# Patient Record
Sex: Female | Born: 1935 | Race: White | Hispanic: No
Health system: Southern US, Community
[De-identification: ages and names within clinical notes are randomized; demographics above are authoritative.]

---

## 2014-06-01 ENCOUNTER — Emergency Department: Payer: Self-pay | Admitting: Emergency Medicine

## 2014-06-01 LAB — COMPREHENSIVE METABOLIC PANEL
ALBUMIN: 4.4 g/dL (ref 3.4–5.0)
Alkaline Phosphatase: 90 U/L
Anion Gap: 5 — ABNORMAL LOW (ref 7–16)
BILIRUBIN TOTAL: 0.3 mg/dL (ref 0.2–1.0)
BUN: 24 mg/dL — AB (ref 7–18)
CO2: 28 mmol/L (ref 21–32)
CREATININE: 1.05 mg/dL (ref 0.60–1.30)
Calcium, Total: 11.4 mg/dL — ABNORMAL HIGH (ref 8.5–10.1)
Chloride: 106 mmol/L (ref 98–107)
EGFR (Non-African Amer.): 48 — ABNORMAL LOW
GFR CALC AF AMER: 55 — AB
Glucose: 144 mg/dL — ABNORMAL HIGH (ref 65–99)
Osmolality: 284 (ref 275–301)
POTASSIUM: 4.9 mmol/L (ref 3.5–5.1)
SGOT(AST): 34 U/L (ref 15–37)
SGPT (ALT): 22 U/L (ref 12–78)
Sodium: 139 mmol/L (ref 136–145)
Total Protein: 8.8 g/dL — ABNORMAL HIGH (ref 6.4–8.2)

## 2014-06-01 LAB — CBC
HCT: 35.6 % (ref 35.0–47.0)
HGB: 11.6 g/dL — AB (ref 12.0–16.0)
MCH: 31.4 pg (ref 26.0–34.0)
MCHC: 32.5 g/dL (ref 32.0–36.0)
MCV: 96 fL (ref 80–100)
PLATELETS: 285 10*3/uL (ref 150–440)
RBC: 3.69 10*6/uL — ABNORMAL LOW (ref 3.80–5.20)
RDW: 13.4 % (ref 11.5–14.5)
WBC: 6.7 10*3/uL (ref 3.6–11.0)

## 2014-06-01 LAB — PROTIME-INR
INR: 0.9
PROTHROMBIN TIME: 12.5 s (ref 11.5–14.7)

## 2014-06-01 LAB — URINALYSIS, COMPLETE
BLOOD: NEGATIVE
Bacteria: NONE SEEN
Bilirubin,UR: NEGATIVE
GLUCOSE, UR: NEGATIVE mg/dL (ref 0–75)
Ketone: NEGATIVE
Leukocyte Esterase: NEGATIVE
Nitrite: NEGATIVE
Ph: 7 (ref 4.5–8.0)
Specific Gravity: 1.01 (ref 1.003–1.030)

## 2014-06-01 LAB — TROPONIN I: Troponin-I: <0.02 ng/mL

## 2014-06-02 ENCOUNTER — Emergency Department: Payer: Self-pay | Admitting: Emergency Medicine

## 2014-06-18 ENCOUNTER — Emergency Department: Payer: Self-pay | Admitting: Emergency Medicine

## 2014-06-29 ENCOUNTER — Emergency Department: Payer: Self-pay | Admitting: Emergency Medicine

## 2014-06-29 LAB — BASIC METABOLIC PANEL
Anion Gap: 4 — ABNORMAL LOW (ref 7–16)
BUN: 20 mg/dL — AB (ref 7–18)
CHLORIDE: 107 mmol/L (ref 98–107)
CO2: 30 mmol/L (ref 21–32)
CREATININE: 1.21 mg/dL (ref 0.60–1.30)
Calcium, Total: 9.6 mg/dL (ref 8.5–10.1)
EGFR (Non-African Amer.): 40 — ABNORMAL LOW
GFR CALC AF AMER: 47 — AB
GLUCOSE: 281 mg/dL — AB (ref 65–99)
OSMOLALITY: 294 (ref 275–301)
POTASSIUM: 4.4 mmol/L (ref 3.5–5.1)
SODIUM: 141 mmol/L (ref 136–145)

## 2014-06-29 LAB — URINALYSIS, COMPLETE
BACTERIA: NONE SEEN
Bilirubin,UR: NEGATIVE
Blood: NEGATIVE
Glucose,UR: 50 mg/dL (ref 0–75)
KETONE: NEGATIVE
Leukocyte Esterase: NEGATIVE
NITRITE: NEGATIVE
PH: 7 (ref 4.5–8.0)
Protein: 30
SQUAMOUS EPITHELIAL: NONE SEEN
Specific Gravity: 1.009 (ref 1.003–1.030)
WBC UR: NONE SEEN /HPF (ref 0–5)

## 2014-06-29 LAB — CBC
HCT: 27.7 % — AB (ref 35.0–47.0)
HGB: 8.8 g/dL — ABNORMAL LOW (ref 12.0–16.0)
MCH: 30 pg (ref 26.0–34.0)
MCHC: 31.7 g/dL — AB (ref 32.0–36.0)
MCV: 95 fL (ref 80–100)
Platelet: 213 10*3/uL (ref 150–440)
RBC: 2.92 10*6/uL — AB (ref 3.80–5.20)
RDW: 12.7 % (ref 11.5–14.5)
WBC: 8 10*3/uL (ref 3.6–11.0)

## 2014-07-12 ENCOUNTER — Emergency Department: Payer: Self-pay | Admitting: Emergency Medicine

## 2014-07-12 LAB — URINALYSIS, COMPLETE
Bacteria: NONE SEEN
Bilirubin,UR: NEGATIVE
Blood: NEGATIVE
GLUCOSE, UR: NEGATIVE mg/dL (ref 0–75)
Ketone: NEGATIVE
Leukocyte Esterase: NEGATIVE
NITRITE: NEGATIVE
Ph: 7 (ref 4.5–8.0)
RBC,UR: NONE SEEN /HPF (ref 0–5)
SPECIFIC GRAVITY: 1.011 (ref 1.003–1.030)
Squamous Epithelial: 12
WBC UR: 3 /HPF (ref 0–5)

## 2014-07-12 LAB — COMPREHENSIVE METABOLIC PANEL
ALK PHOS: 76 U/L
AST: 26 U/L (ref 15–37)
Albumin: 3.1 g/dL — ABNORMAL LOW (ref 3.4–5.0)
Anion Gap: 5 — ABNORMAL LOW (ref 7–16)
BUN: 27 mg/dL — ABNORMAL HIGH (ref 7–18)
Bilirubin,Total: 0.2 mg/dL (ref 0.2–1.0)
CHLORIDE: 107 mmol/L (ref 98–107)
Calcium, Total: 10.3 mg/dL — ABNORMAL HIGH (ref 8.5–10.1)
Co2: 29 mmol/L (ref 21–32)
Creatinine: 1.11 mg/dL (ref 0.60–1.30)
EGFR (Non-African Amer.): 45 — ABNORMAL LOW
GFR CALC AF AMER: 52 — AB
Glucose: 124 mg/dL — ABNORMAL HIGH (ref 65–99)
OSMOLALITY: 288 (ref 275–301)
POTASSIUM: 4.4 mmol/L (ref 3.5–5.1)
SGPT (ALT): 15 U/L
Sodium: 141 mmol/L (ref 136–145)
Total Protein: 6.5 g/dL (ref 6.4–8.2)

## 2014-07-12 LAB — CBC
HCT: 28.3 % — ABNORMAL LOW (ref 35.0–47.0)
HGB: 9.3 g/dL — AB (ref 12.0–16.0)
MCH: 30.5 pg (ref 26.0–34.0)
MCHC: 32.8 g/dL (ref 32.0–36.0)
MCV: 93 fL (ref 80–100)
PLATELETS: 230 10*3/uL (ref 150–440)
RBC: 3.04 10*6/uL — ABNORMAL LOW (ref 3.80–5.20)
RDW: 12.8 % (ref 11.5–14.5)
WBC: 8.2 10*3/uL (ref 3.6–11.0)

## 2015-07-13 IMAGING — CT CT HEAD WITHOUT CONTRAST
2 of 5 series · 13 of 47 positions shown, 16 images · non-contrast
Comparison: 06/01/2014

CLINICAL DATA: Fall with head injury

EXAM:
CT HEAD WITHOUT CONTRAST
CT CERVICAL SPINE WITHOUT CONTRAST
TECHNIQUE: Multidetector CT imaging of the head and cervical spine was
performed following the standard protocol without intravenous
contrast. Multiplanar CT image reconstructions of the cervical spine
were also generated.

[Series 5: soft tissue · axial · 0.33mm/px · z∈[+253,+397]mm · 10 of 88 slices shown, 13 images]
[im 8/88  brain]
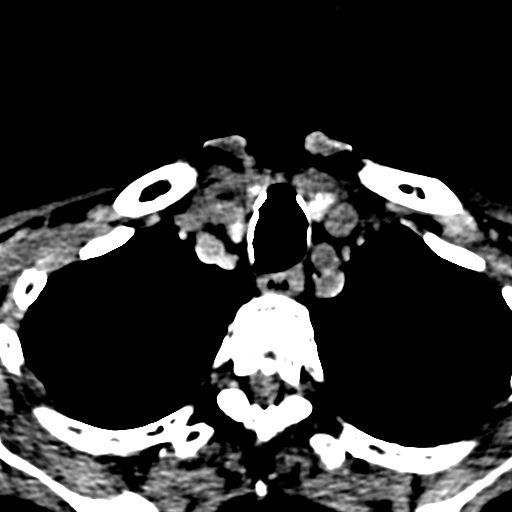
[im 8/88  bone]
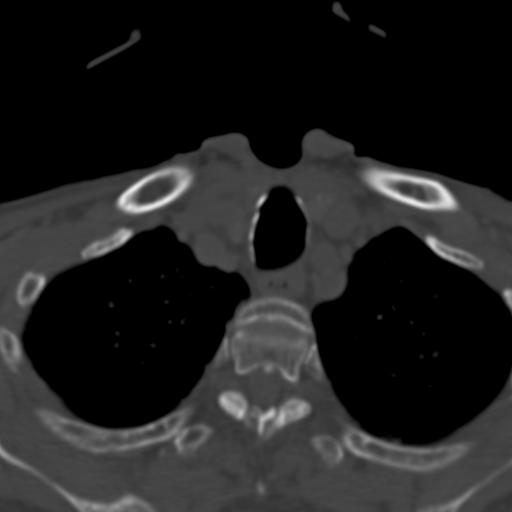
[im 16/88  brain]
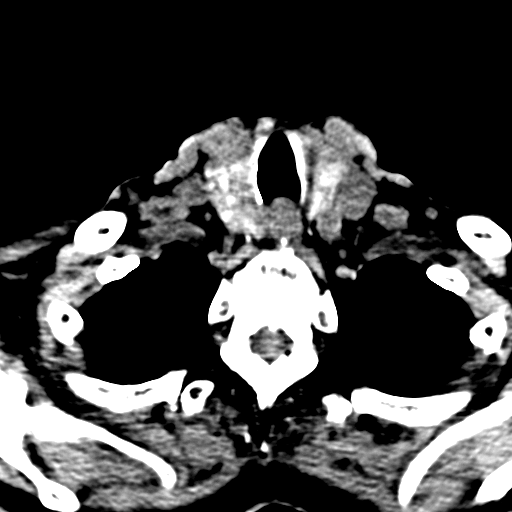
[im 24/88  brain]
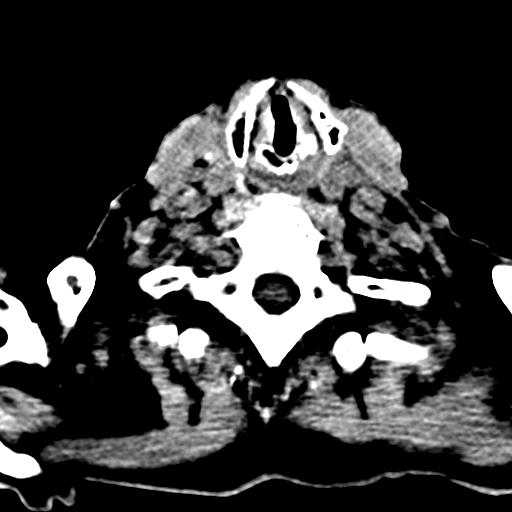
[im 32/88  brain]
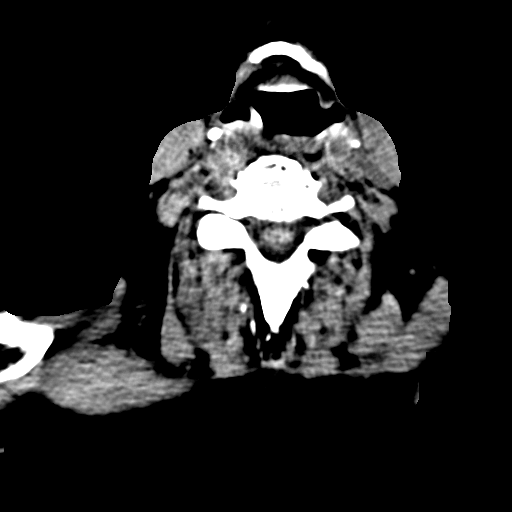
[im 40/88  brain]
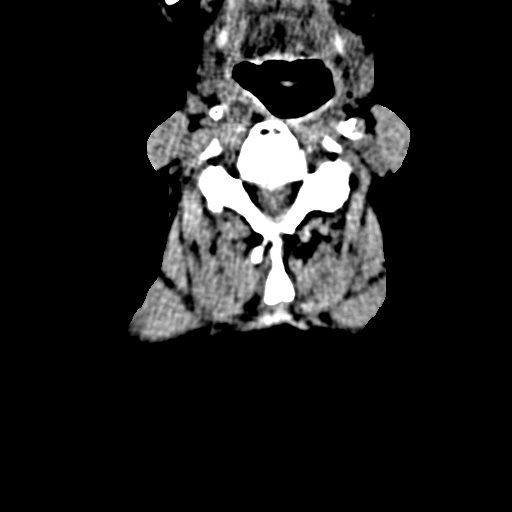
[im 40/88  bone]
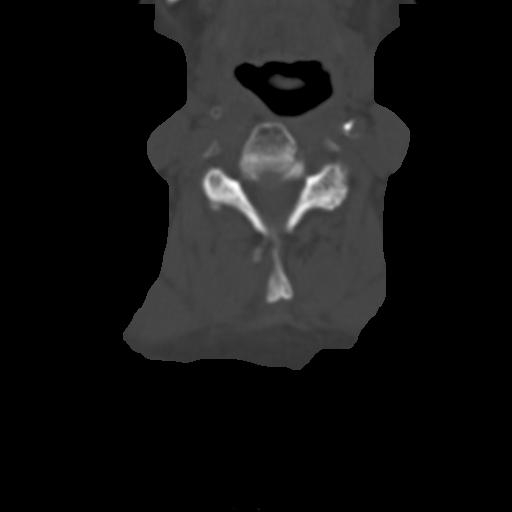
[im 48/88  brain]
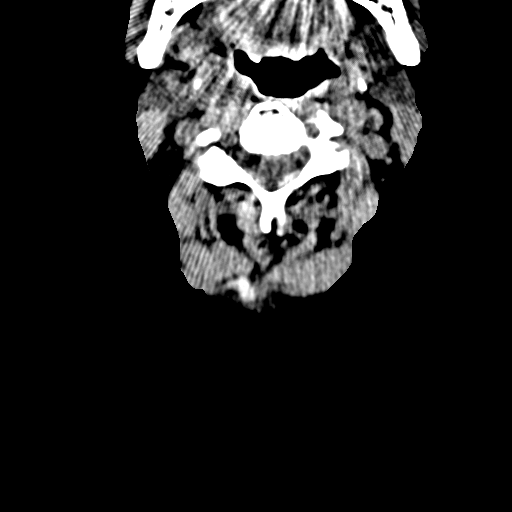
[im 56/88  brain]
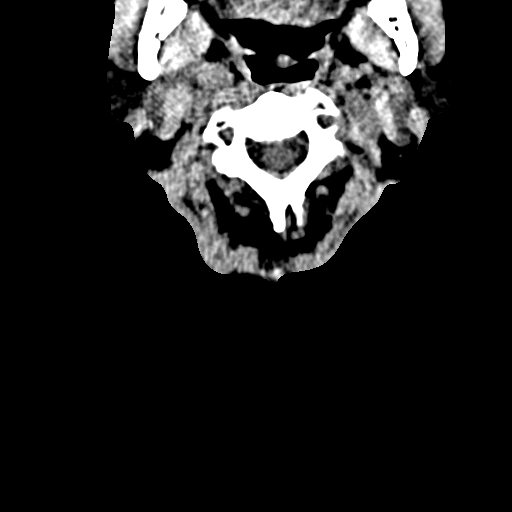
[im 64/88  brain]
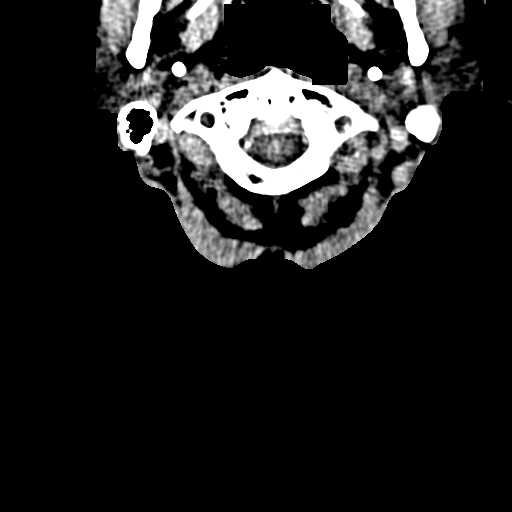
[im 72/88  brain]
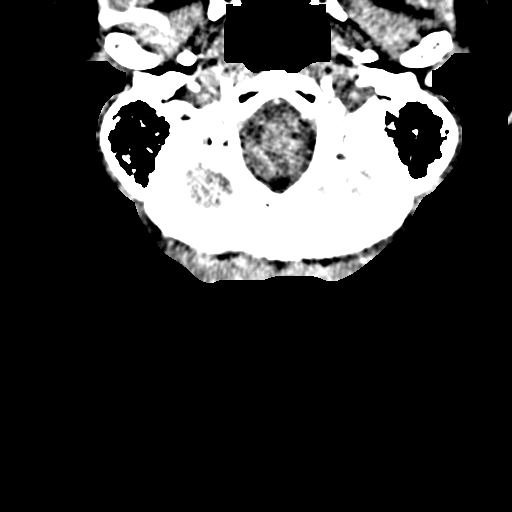
[im 72/88  bone]
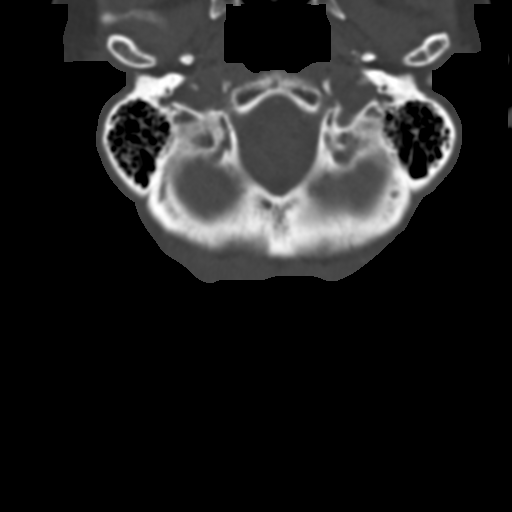
[im 80/88  brain]
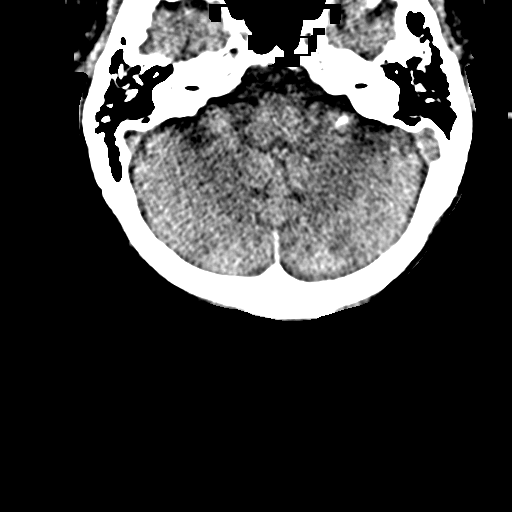

[Series 9: coronal bone · coronal · 0.18mm/px · 3 of 42 slices shown]
[im 14/42  brain]
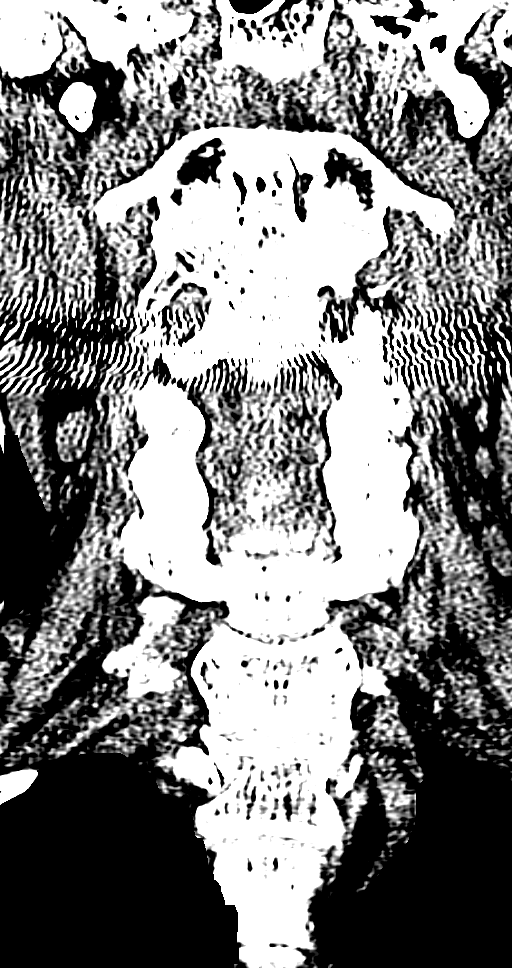
[im 19/42  brain]
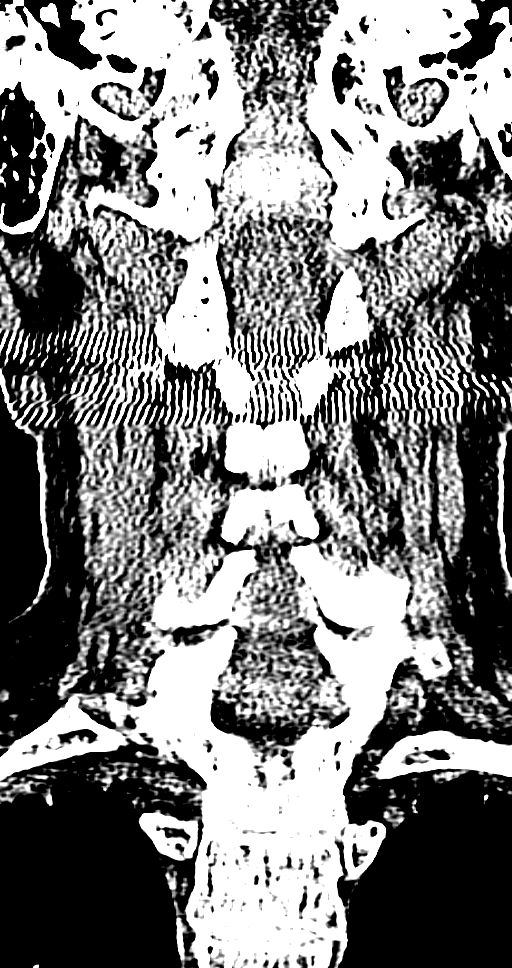
[im 23/42  brain]
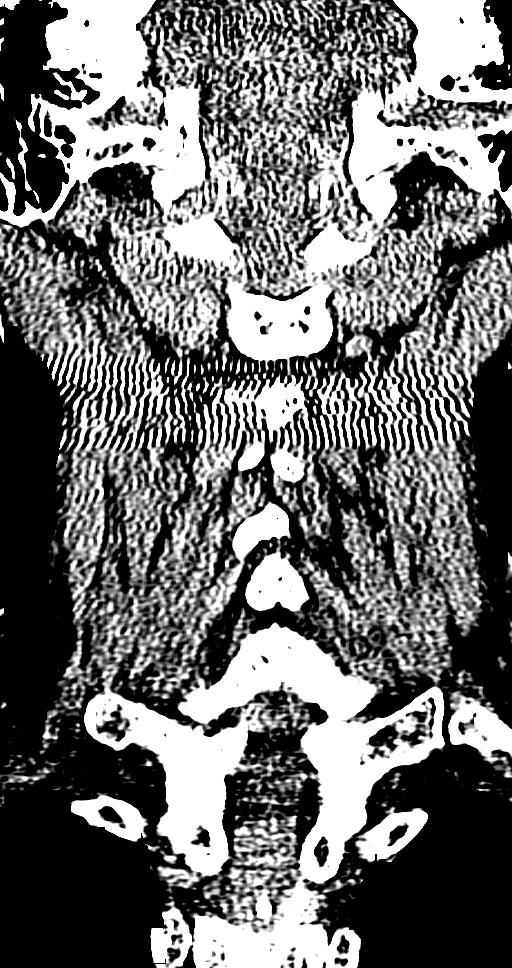

[13 of 47 positions shown; findings below may reference images not displayed]

FINDINGS: CT HEAD FINDINGS

Skull and Sinuses:Negative for fracture or destructive process. The
mastoids, middle ears, and imaged paranasal sinuses are clear.

Orbits: Bilateral cataract resection.

Brain: No evidence of acute abnormality, such as acute infarction,
hemorrhage, hydrocephalus, or mass lesion/mass effect. There is
generalized brain atrophy. Extensive chronic small vessel disease
with ischemic gliosis confluent throughout the periventricular and
deep cerebral white matter.

CT CERVICAL SPINE FINDINGS

Negative for acute fracture or subluxation. No prevertebral edema.
No gross cervical canal hematoma. The diffuse facet osteoarthritis
with spurring, present on the left more than right. Degenerative
disc changes are relatively mild, especially for age. No significant
osseous canal or foraminal stenosis.
IMPRESSION: 1. No acute intracranial or cervical spine injury.
2. Brain atrophy and chronic small vessel disease.

## 2015-07-13 IMAGING — CR RIGHT HIP - COMPLETE 2+ VIEW
1 series · 3 of 3 positions shown · non-contrast
Comparison: None.

CLINICAL DATA: Fall with hip pain

EXAM:
RIGHT HIP - COMPLETE 2+ VIEW

[Series 7: t pelvis ap · 0.14mm/px · 3 of 3 slices shown]
[im 1/3]
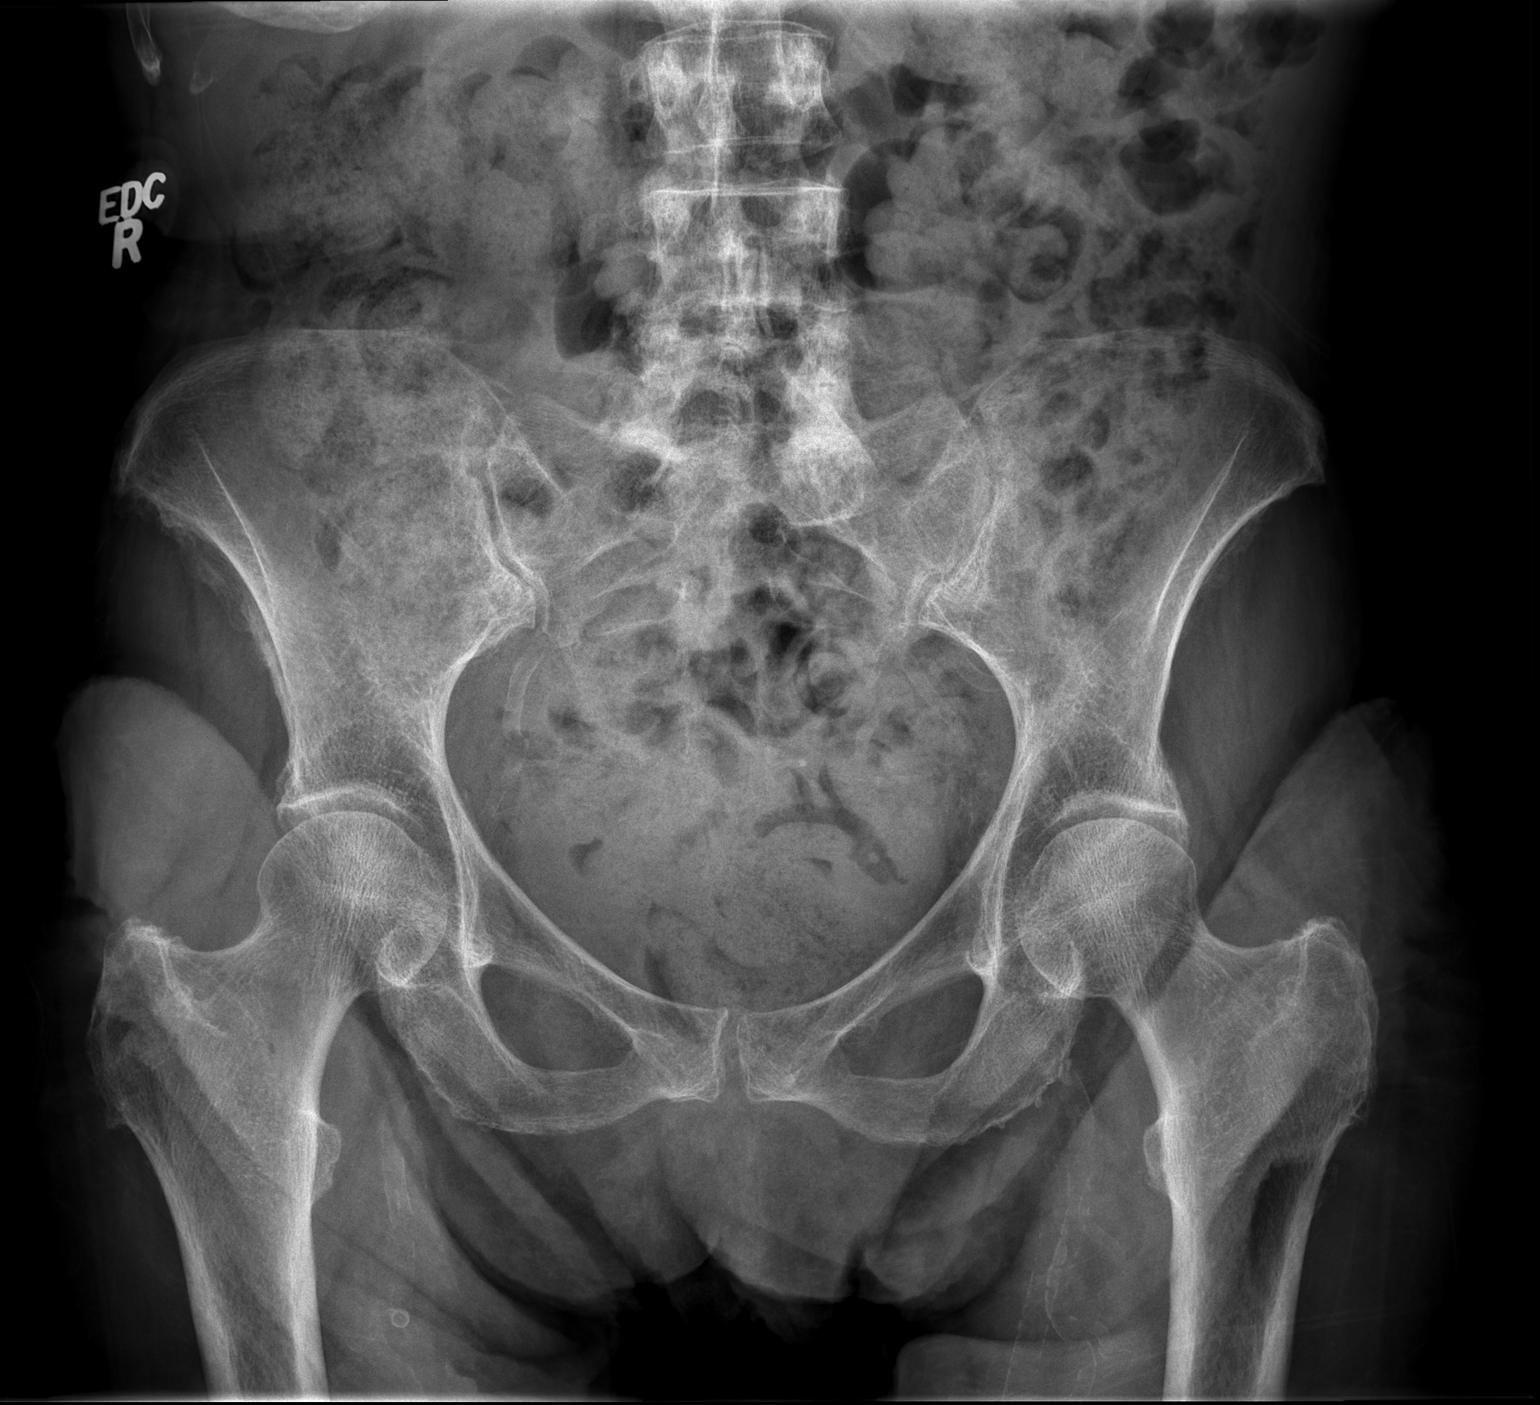
[im 2/3]
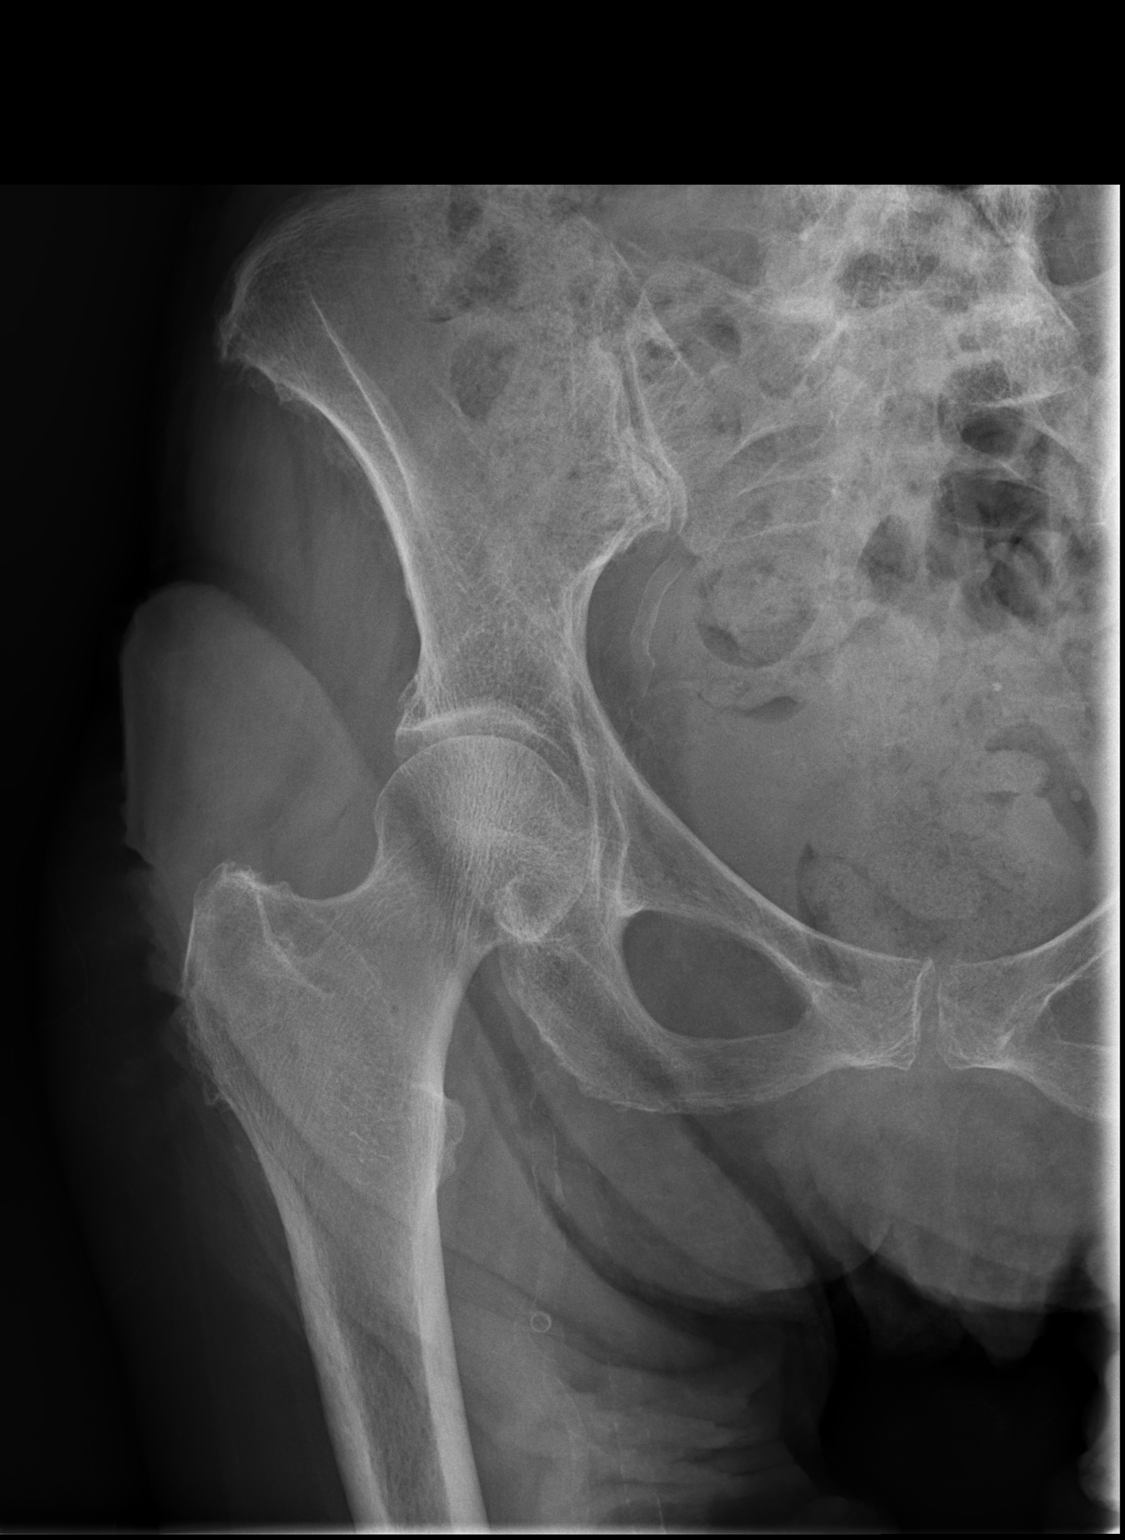
[im 3/3]
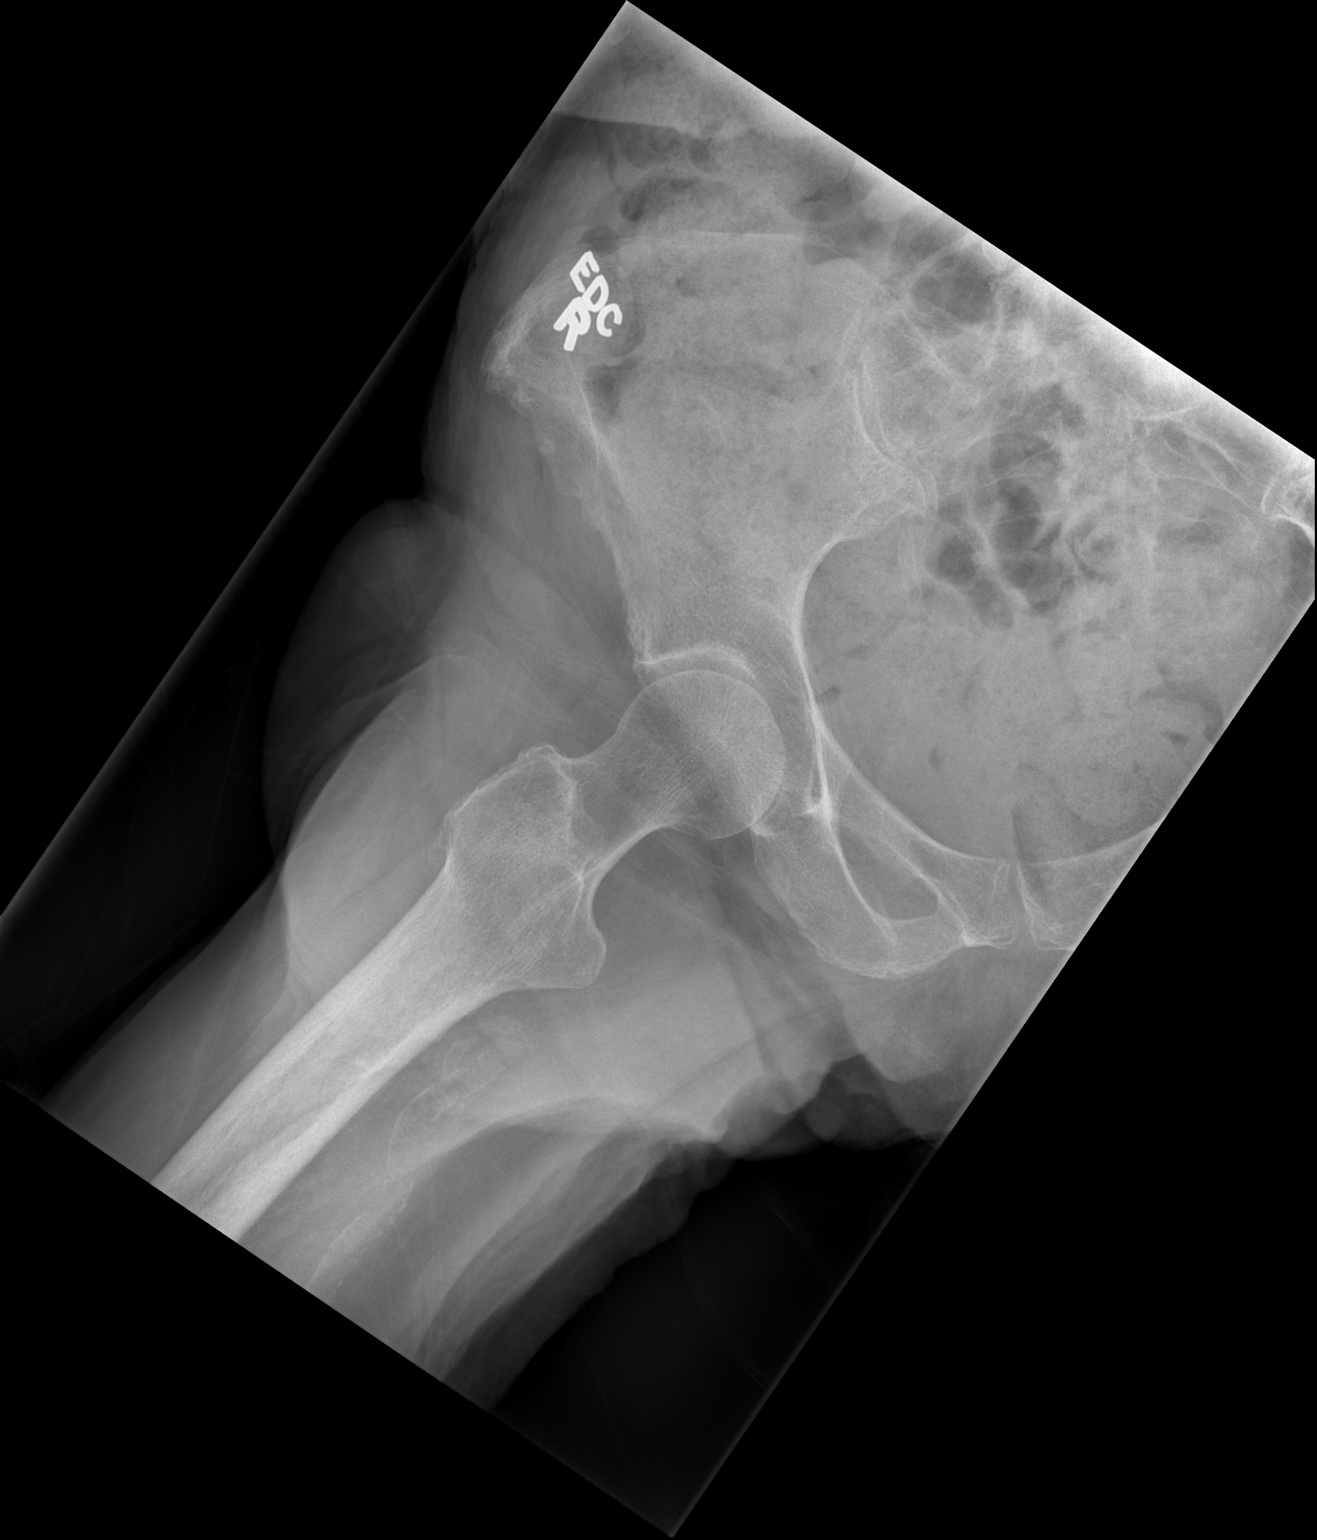

[3 of 3 positions shown; findings below may reference images not displayed]

FINDINGS: There is no evidence of hip fracture or dislocation. No significant
hip narrowing.

Osteopenia and diffuse arterial calcification.
IMPRESSION: No acute osseous findings.

## 2018-10-31 ENCOUNTER — Telehealth: Payer: Self-pay | Admitting: Student

## 2018-10-31 NOTE — Telephone Encounter (Signed)
NP phone call 10/30/18 at 1:10pm: received call from patient's son Thayer OhmChris. He states that when he visited patient this past weekend, she was not communicating at all, appears to be declining. He would like for Palliative to assess her again. Discussed criteria for Hospice. He is made aware that patient will be added to schedule to be seen.LR

## 2018-11-01 ENCOUNTER — Non-Acute Institutional Stay: Payer: Self-pay | Admitting: Student

## 2018-11-01 VITALS — BP 104/60 | HR 96 | Resp 24 | Ht 60.0 in | Wt 97.4 lb

## 2018-11-01 DIAGNOSIS — Z515 Encounter for palliative care: Secondary | ICD-10-CM

## 2018-11-01 DIAGNOSIS — F419 Anxiety disorder, unspecified: Secondary | ICD-10-CM | POA: Insufficient documentation

## 2018-11-01 DIAGNOSIS — F0150 Vascular dementia without behavioral disturbance: Secondary | ICD-10-CM | POA: Insufficient documentation

## 2018-11-01 DIAGNOSIS — F319 Bipolar disorder, unspecified: Secondary | ICD-10-CM | POA: Insufficient documentation

## 2018-11-01 DIAGNOSIS — I10 Essential (primary) hypertension: Secondary | ICD-10-CM | POA: Insufficient documentation

## 2018-11-01 DIAGNOSIS — I639 Cerebral infarction, unspecified: Secondary | ICD-10-CM | POA: Insufficient documentation

## 2018-11-01 NOTE — Progress Notes (Signed)
[facility] Telephone: [phone] Fax: [phone]  PATIENT NAME: Karen Walters DOB: [birth year] MRN: [id number]  PRIMARY CARE PROVIDER:  Dr. Cottie Banda, [facility].   REFERRING PROVIDER:  Dr. Cottie Banda, [facility]  RESPONSIBLE PARTY:   Erskine Emery, son  ASSESSMENT: Ms. Persico is alert, oriented to person only. Her speech is mostly mumbled, incoherent and difficult to understand. She is thin, frail ill appearing. Patient has lost weight > 10% in the past 6 months, declines in communication, speech, more withdrawn. She is sleeping more throughout the day. She is dependent for all activities of adl's; care needs are anticipated by staff. She is no longer feeding herself. Will refer for Hospice Assessment. Discussed with son Thayer Ohm and med tech Byrd Hesselbach.     RECOMMENDATIONS and PLAN:  1. CODE STATUS. DNR is already in place, on chart. Continued discussion regarding desire for comfortable death including details regarding no heroic or extraordinary measures will be used to keep the patient alive.  2. Medical goals of therapy: Patient being referred for Hospice admission assessment per criteria  Guidelines for CVA.  Criteria for CVA: The patient has both 1 and 2. 1. Poor functional status PPS* ? 40% AND 2. Poor nutritional status with inability to maintain sufficient fluid and calorie intake with ?1 of the following: ? 10% weight loss in past 6 months ?7.5% weight loss in past 3 months Serum albumin <2.5 gm/dl Current history of pulmonary aspiration without effective response to speech therapy interventions to improve dysphagia and decrease aspiration events  3. Discharge Planning: Patient will continue to reside at [facility] on locked memory unit. 4. Emotional/spiritual support: Discussed with son Thayer Ohm, med tech Byrd Hesselbach; they are encouraged to call with questions.   Palliative Care to continue to follow for emotional/spiritual support, ongoing discussions  trajectory of chronic disease progression, medical goals of therapy, monitor for symptoms with management, and reduce ED and hospitalizations with recommendations.    I spent 40 minutes providing this consultation,  from 1:30pm to 2:10pm. More than 50% of the time in this consultation was spent coordinating communication.   HISTORY OF PRESENT ILLNESS:  Karen Walters is a 84 y.o. year old female with multiple medical problems including Vascular dementia, CVA, bipolar disorder, anxiety, hypertension. Palliative Care was asked to help address goals of care. Patient was discharged from Hospice services on 07/31/2018 due to stability. Son Thayer Ohm, his sister and family report further changes/declines. Thayer Ohm states patient was unable to communicate/respond at all when he visited her this past weekend. Her weight was 110 pounds the end of August, her weight in the past two weeks was 97 pounds, > than 10% loss. Her BMI is 19. She is sleeping more throughout the day.  She is more withdrawn. She now requires assist x 2 for transfers, stiffer. Worsening pain with any movement. She is coughing with foods/fluids and holding food in her mouth per staff. Continues to refuse medications. Her appetite has declined further; she is eating 0-25% at best, now being fed and will not initiate feeding herself at all.   CODE STATUS: DNR  PPS:30% HOSPICE ELIGIBILITY/DIAGNOSIS: YES  PAST MEDICAL HISTORY: No past medical history on file.  SOCIAL HX:  Social History   Tobacco Use  . Smoking status: Not on file  Substance Use Topics  . Alcohol use: Not on file    ALLERGIES: Allergies not on file   PERTINENT MEDICATIONS:  No outpatient encounter medications on file as of 82/10/2018.  No facility-administered encounter medications on file as of 11/01/2018.     PHYSICAL EXAM:   General: NAD, frail, ill appearing, thin Cardiovascular: regular rate and rhythm Pulmonary: rhonchi bilaterally Abdomen: soft, nontender, +  bowel sounds Extremities: 1+ non-pitting edema Skin: no rashes Neurological: generalized weakness  Luella CookLaToya S Tomiko Schoon, NP

## 2018-11-02 ENCOUNTER — Encounter: Payer: Self-pay | Admitting: Student

## 2018-11-05 ENCOUNTER — Encounter: Payer: Self-pay | Admitting: Student

## 2018-12-22 DEATH — deceased
